# Patient Record
Sex: Female | Born: 1990
Health system: Southern US, Community
[De-identification: ages and names within clinical notes are randomized; demographics above are authoritative.]

---

## 2010-12-08 NOTE — L&D Delivery Note (Signed)
Delivery Note At 5:17 AM a viable female was delivered via Vaginal, Spontaneous Delivery (Presentation: ;  ).  APGAR: , ; weight .   Placenta status: Intact, Pathology.  Cord: 3 vessels with the following complications: None.  Cord pH: not done  Anesthesia: Epidural  Episiotomy: None Lacerations: 1st degree;Perineal Suture Repair: 2.0 vicryl Est. Blood Loss (mL):   Mom to postpartum.  Baby to nursery-stable.  Jamie Sosa A 08/13/2011, 5:31 AM

## 2011-01-13 ENCOUNTER — Inpatient Hospital Stay (INDEPENDENT_AMBULATORY_CARE_PROVIDER_SITE_OTHER)
Admission: RE | Admit: 2011-01-13 | Discharge: 2011-01-13 | Disposition: A | Discharge status: Home / Self Care | Payer: Self-pay | Source: Ambulatory Visit | Attending: Family Medicine | Admitting: Family Medicine

## 2011-01-13 DIAGNOSIS — O2390 Unspecified genitourinary tract infection in pregnancy, unspecified trimester: Secondary | ICD-10-CM

## 2011-01-13 LAB — POCT URINALYSIS DIPSTICK
Ketones, ur: NEGATIVE mg/dL
Protein, ur: 30 mg/dL — AB
pH: 6 (ref 5.0–8.0)

## 2011-01-13 LAB — WET PREP, GENITAL

## 2011-01-15 LAB — GC/CHLAMYDIA PROBE AMP, GENITAL: GC Probe Amp, Genital: NEGATIVE

## 2011-05-20 ENCOUNTER — Other Ambulatory Visit (HOSPITAL_COMMUNITY): Payer: Self-pay | Admitting: Obstetrics

## 2011-05-20 DIAGNOSIS — Z3689 Encounter for other specified antenatal screening: Secondary | ICD-10-CM

## 2011-05-23 ENCOUNTER — Encounter (HOSPITAL_COMMUNITY): Payer: Self-pay

## 2011-05-23 ENCOUNTER — Ambulatory Visit (HOSPITAL_COMMUNITY)
Admission: RE | Admit: 2011-05-23 | Discharge: 2011-05-23 | Disposition: A | Discharge status: Home / Self Care | Payer: Medicaid Other | Source: Ambulatory Visit | Attending: Obstetrics | Admitting: Obstetrics

## 2011-05-23 DIAGNOSIS — Z3689 Encounter for other specified antenatal screening: Secondary | ICD-10-CM

## 2011-05-23 DIAGNOSIS — Z363 Encounter for antenatal screening for malformations: Secondary | ICD-10-CM | POA: Insufficient documentation

## 2011-05-23 DIAGNOSIS — Z1389 Encounter for screening for other disorder: Secondary | ICD-10-CM | POA: Insufficient documentation

## 2011-05-23 DIAGNOSIS — O358XX0 Maternal care for other (suspected) fetal abnormality and damage, not applicable or unspecified: Secondary | ICD-10-CM | POA: Insufficient documentation

## 2011-05-23 DIAGNOSIS — O0930 Supervision of pregnancy with insufficient antenatal care, unspecified trimester: Secondary | ICD-10-CM | POA: Insufficient documentation

## 2011-05-23 LAB — ANTIBODY SCREEN: Antibody Screen: NEGATIVE

## 2011-05-23 LAB — RUBELLA ANTIBODY, IGM: Rubella: IMMUNE

## 2011-05-23 LAB — HIV ANTIBODY (ROUTINE TESTING W REFLEX): HIV: NONREACTIVE

## 2011-05-29 ENCOUNTER — Other Ambulatory Visit (HOSPITAL_COMMUNITY): Payer: Self-pay | Admitting: Obstetrics

## 2011-05-29 DIAGNOSIS — O359XX0 Maternal care for (suspected) fetal abnormality and damage, unspecified, not applicable or unspecified: Secondary | ICD-10-CM

## 2011-06-13 ENCOUNTER — Ambulatory Visit (HOSPITAL_COMMUNITY)
Admission: RE | Admit: 2011-06-13 | Discharge: 2011-06-13 | Disposition: A | Discharge status: Home / Self Care | Payer: BC Managed Care – PPO | Source: Ambulatory Visit | Attending: Obstetrics | Admitting: Obstetrics

## 2011-06-13 DIAGNOSIS — O0930 Supervision of pregnancy with insufficient antenatal care, unspecified trimester: Secondary | ICD-10-CM | POA: Insufficient documentation

## 2011-06-13 DIAGNOSIS — Z3689 Encounter for other specified antenatal screening: Secondary | ICD-10-CM | POA: Insufficient documentation

## 2011-06-13 DIAGNOSIS — O359XX0 Maternal care for (suspected) fetal abnormality and damage, unspecified, not applicable or unspecified: Secondary | ICD-10-CM

## 2011-07-14 LAB — STREP B DNA PROBE: GBS: NEGATIVE

## 2011-08-07 ENCOUNTER — Telehealth (HOSPITAL_COMMUNITY): Payer: Self-pay | Admitting: *Deleted

## 2011-08-08 ENCOUNTER — Encounter (HOSPITAL_COMMUNITY): Payer: Self-pay | Admitting: *Deleted

## 2011-08-08 ENCOUNTER — Telehealth (HOSPITAL_COMMUNITY): Payer: Self-pay | Admitting: *Deleted

## 2011-08-08 NOTE — Telephone Encounter (Signed)
Preadmission screen  

## 2011-08-11 ENCOUNTER — Inpatient Hospital Stay (HOSPITAL_COMMUNITY)
Admission: RE | Admit: 2011-08-11 | Discharge: 2011-08-15 | DRG: 373 | Disposition: A | Discharge status: Home / Self Care | Payer: BC Managed Care – PPO | Source: Ambulatory Visit | Attending: Obstetrics | Admitting: Obstetrics

## 2011-08-11 ENCOUNTER — Encounter (HOSPITAL_COMMUNITY): Payer: Self-pay

## 2011-08-11 LAB — CBC
Platelets: 257 10*3/uL (ref 150–400)
RDW: 14 % (ref 11.5–15.5)
WBC: 10.1 10*3/uL (ref 4.0–10.5)

## 2011-08-11 MED ORDER — LACTATED RINGERS IV SOLN
INTRAVENOUS | Status: DC
  Administered 2011-08-11 – 2011-08-13 (×7): via INTRAVENOUS

## 2011-08-11 MED ORDER — IBUPROFEN 600 MG PO TABS
600.0000 mg | ORAL_TABLET | Freq: Four times a day (QID) | ORAL | Status: DC | PRN
  Administered 2011-08-13: 600 mg via ORAL
  Filled 2011-08-11: qty 1

## 2011-08-11 MED ORDER — LACTATED RINGERS IV SOLN
500.0000 mL | INTRAVENOUS | Status: DC | PRN
  Administered 2011-08-11 – 2011-08-12 (×3): 500 mL via INTRAVENOUS

## 2011-08-11 MED ORDER — OXYCODONE-ACETAMINOPHEN 5-325 MG PO TABS: 2.0000 | ORAL_TABLET | ORAL | Status: DC | PRN

## 2011-08-11 MED ORDER — MISOPROSTOL 25 MCG QUARTER TABLET
25.0000 ug | ORAL_TABLET | ORAL | Status: DC | PRN
  Administered 2011-08-11 – 2011-08-12 (×2): 25 ug via VAGINAL
  Filled 2011-08-11 (×2): qty 0.25
  Filled 2011-08-11: qty 1

## 2011-08-11 MED ORDER — LIDOCAINE HCL (PF) 1 % IJ SOLN
30.0000 mL | INTRAMUSCULAR | Status: AC | PRN
  Administered 2011-08-13: 30 mL via SUBCUTANEOUS
  Filled 2011-08-11: qty 30

## 2011-08-11 MED ORDER — TERBUTALINE SULFATE 1 MG/ML IJ SOLN: 0.2500 mg | Freq: Once | INTRAMUSCULAR | Status: AC | PRN

## 2011-08-11 MED ORDER — BUTORPHANOL TARTRATE 2 MG/ML IJ SOLN: 1.0000 mg | INTRAMUSCULAR | Status: DC | PRN

## 2011-08-11 MED ORDER — ACETAMINOPHEN 325 MG PO TABS
650.0000 mg | ORAL_TABLET | ORAL | Status: DC | PRN
  Administered 2011-08-13: 650 mg via ORAL
  Filled 2011-08-11: qty 2

## 2011-08-11 MED ORDER — CITRIC ACID-SODIUM CITRATE 334-500 MG/5ML PO SOLN: 30.0000 mL | ORAL | Status: DC | PRN

## 2011-08-11 MED ORDER — OXYTOCIN BOLUS FROM INFUSION
500.0000 mL | Freq: Once | INTRAVENOUS | Status: AC
  Administered 2011-08-13: 500 mL via INTRAVENOUS
  Filled 2011-08-11: qty 500
  Filled 2011-08-11: qty 1000

## 2011-08-11 MED ORDER — OXYTOCIN 20 UNITS IN LACTATED RINGERS INFUSION - SIMPLE
125.0000 mL/h | Freq: Once | INTRAVENOUS | Status: AC
  Administered 2011-08-13: 500 mL/h via INTRAVENOUS

## 2011-08-11 MED ORDER — ONDANSETRON HCL 4 MG/2ML IJ SOLN
4.0000 mg | Freq: Four times a day (QID) | INTRAMUSCULAR | Status: DC | PRN
  Administered 2011-08-12 – 2011-08-13 (×2): 4 mg via INTRAVENOUS
  Filled 2011-08-11 (×2): qty 2

## 2011-08-11 NOTE — Plan of Care (Signed)
Problem: Consults Goal: Birthing Suites Patient Information Press F2 to bring up selections list  Outcome: Completed/Met Date Met:  08/11/11  Pt > [redacted] weeks EGA and Inpatient induction

## 2011-08-12 ENCOUNTER — Encounter (HOSPITAL_COMMUNITY): Payer: Self-pay | Admitting: Anesthesiology

## 2011-08-12 ENCOUNTER — Inpatient Hospital Stay (HOSPITAL_COMMUNITY): Payer: BC Managed Care – PPO | Admitting: Anesthesiology

## 2011-08-12 LAB — RPR: RPR Ser Ql: NONREACTIVE

## 2011-08-12 MED ORDER — EPHEDRINE 5 MG/ML INJ
10.0000 mg | INTRAVENOUS | Status: DC | PRN
  Filled 2011-08-12: qty 4

## 2011-08-12 MED ORDER — LIDOCAINE-EPINEPHRINE (PF) 2 %-1:200000 IJ SOLN
INTRAMUSCULAR | Status: AC
  Filled 2011-08-12: qty 20

## 2011-08-12 MED ORDER — TERBUTALINE SULFATE 1 MG/ML IJ SOLN: 0.2500 mg | Freq: Once | INTRAMUSCULAR | Status: AC | PRN

## 2011-08-12 MED ORDER — PHENYLEPHRINE 40 MCG/ML (10ML) SYRINGE FOR IV PUSH (FOR BLOOD PRESSURE SUPPORT)
80.0000 ug | PREFILLED_SYRINGE | INTRAVENOUS | Status: DC | PRN
  Filled 2011-08-12: qty 5

## 2011-08-12 MED ORDER — PHENYLEPHRINE 40 MCG/ML (10ML) SYRINGE FOR IV PUSH (FOR BLOOD PRESSURE SUPPORT)
80.0000 ug | PREFILLED_SYRINGE | INTRAVENOUS | Status: DC | PRN
  Filled 2011-08-12 (×2): qty 5

## 2011-08-12 MED ORDER — LACTATED RINGERS IV SOLN
500.0000 mL | Freq: Once | INTRAVENOUS | Status: AC
  Administered 2011-08-12: 1000 mL via INTRAVENOUS

## 2011-08-12 MED ORDER — SODIUM BICARBONATE 8.4 % IV SOLN
INTRAVENOUS | Status: DC | PRN
  Administered 2011-08-12 (×2): 5 mL via EPIDURAL

## 2011-08-12 MED ORDER — DIPHENHYDRAMINE HCL 50 MG/ML IJ SOLN: 12.5000 mg | INTRAMUSCULAR | Status: DC | PRN

## 2011-08-12 MED ORDER — FENTANYL 2.5 MCG/ML BUPIVACAINE 1/10 % EPIDURAL INFUSION (WH - ANES)
14.0000 mL/h | INTRAMUSCULAR | Status: DC
  Administered 2011-08-12 (×2): 14 mL/h via EPIDURAL
  Administered 2011-08-12: 13 mL/h via EPIDURAL
  Administered 2011-08-12 – 2011-08-13 (×4): 14 mL/h via EPIDURAL
  Filled 2011-08-12 (×7): qty 60

## 2011-08-12 MED ORDER — EPHEDRINE 5 MG/ML INJ
10.0000 mg | INTRAVENOUS | Status: DC | PRN
  Filled 2011-08-12 (×2): qty 4

## 2011-08-12 MED ORDER — OXYTOCIN 20 UNITS IN LACTATED RINGERS INFUSION - SIMPLE
1.0000 m[IU]/min | INTRAVENOUS | Status: DC
  Administered 2011-08-12: 1 m[IU]/min via INTRAVENOUS
  Filled 2011-08-12: qty 1000

## 2011-08-12 MED ORDER — SODIUM BICARBONATE 8.4 % IV SOLN
INTRAVENOUS | Status: AC
  Filled 2011-08-12: qty 50

## 2011-08-12 MED ORDER — OXYTOCIN 20 UNITS IN LACTATED RINGERS INFUSION - SIMPLE: 1.0000 m[IU]/min | INTRAVENOUS | Status: DC

## 2011-08-12 NOTE — Anesthesia Procedure Notes (Addendum)
Epidural  Start time: 08/12/2011 9:59 AM  Staffing Anesthesiologist: Cristela Blue EDWARD  Needle:  Needle insertion depth: 6 cm Catheter at skin depth: 12 cm  Additional Notes Dosing of Epidural: 1st dose, Through needle...... 4mg  Marcaine 2nd dose, through catheter.... epi 1:200K + Xylocaine 40 mg 3rd dose, through catheter...Marland KitchenMarland Kitchenepi 1:200K + Xylocaine 40 mg Each dose occurred after waiting 3 min,patient was free of IV sx; and patient exhibits no evidence of SA injection  Patient is more comfortable after epidural dosed. Please see RN's note for documentation of vital signs,and FHR which are stable.

## 2011-08-12 NOTE — Progress Notes (Signed)
Ok to not give another dose of cytotec; start pitocin.

## 2011-08-12 NOTE — Anesthesia Preprocedure Evaluation (Signed)

## 2011-08-12 NOTE — Progress Notes (Signed)
  Cervix 2 cm 80% vertex -3 station Membranes ruptured fluid clear Continue low-dose Pitocin Reactive tracing

## 2011-08-12 NOTE — Progress Notes (Signed)
  5 PM today cervix was 7 cm and the vertex -1 station She's no 7-8 cm 100% with the vertex at a 0 to plus one station And IUPC was inserted she's now on 50 milliunits of Pitocin slow progress Tracing reactive

## 2011-08-13 ENCOUNTER — Other Ambulatory Visit: Payer: Self-pay | Admitting: Obstetrics

## 2011-08-13 ENCOUNTER — Encounter (HOSPITAL_COMMUNITY): Payer: Self-pay

## 2011-08-13 MED ORDER — DIBUCAINE 1 % RE OINT: 1.0000 "application " | TOPICAL_OINTMENT | RECTAL | Status: DC | PRN

## 2011-08-13 MED ORDER — BENZOCAINE-MENTHOL 20-0.5 % EX AERO
INHALATION_SPRAY | CUTANEOUS | Status: AC
  Filled 2011-08-13: qty 56

## 2011-08-13 MED ORDER — PRENATAL PLUS 27-1 MG PO TABS
1.0000 | ORAL_TABLET | Freq: Every day | ORAL | Status: DC
  Administered 2011-08-13 – 2011-08-15 (×3): 1 via ORAL
  Filled 2011-08-13 (×3): qty 1

## 2011-08-13 MED ORDER — DIPHENHYDRAMINE HCL 25 MG PO CAPS: 25.0000 mg | ORAL_CAPSULE | Freq: Four times a day (QID) | ORAL | Status: DC | PRN

## 2011-08-13 MED ORDER — ONDANSETRON HCL 4 MG/2ML IJ SOLN: 4.0000 mg | INTRAMUSCULAR | Status: DC | PRN

## 2011-08-13 MED ORDER — WITCH HAZEL-GLYCERIN EX PADS: 1.0000 "application " | MEDICATED_PAD | CUTANEOUS | Status: DC | PRN

## 2011-08-13 MED ORDER — LANOLIN HYDROUS EX OINT: TOPICAL_OINTMENT | CUTANEOUS | Status: DC | PRN

## 2011-08-13 MED ORDER — ZOLPIDEM TARTRATE 5 MG PO TABS: 5.0000 mg | ORAL_TABLET | Freq: Every evening | ORAL | Status: DC | PRN

## 2011-08-13 MED ORDER — SODIUM CHLORIDE 0.9 % IV SOLN
2.0000 mg | Freq: Once | INTRAVENOUS | Status: AC
  Administered 2011-08-13: 2 mg via INTRAVENOUS
  Filled 2011-08-13: qty 2

## 2011-08-13 MED ORDER — SIMETHICONE 80 MG PO CHEW: 80.0000 mg | CHEWABLE_TABLET | ORAL | Status: DC | PRN

## 2011-08-13 MED ORDER — FERROUS SULFATE 325 (65 FE) MG PO TABS
325.0000 mg | ORAL_TABLET | Freq: Two times a day (BID) | ORAL | Status: DC
  Administered 2011-08-13 – 2011-08-15 (×4): 325 mg via ORAL
  Filled 2011-08-13 (×4): qty 1

## 2011-08-13 MED ORDER — TETANUS-DIPHTH-ACELL PERTUSSIS 5-2.5-18.5 LF-MCG/0.5 IM SUSP
0.5000 mL | Freq: Once | INTRAMUSCULAR | Status: AC
  Administered 2011-08-14: 0.5 mL via INTRAMUSCULAR
  Filled 2011-08-13: qty 0.5

## 2011-08-13 MED ORDER — BENZOCAINE-MENTHOL 20-0.5 % EX AERO
1.0000 "application " | INHALATION_SPRAY | CUTANEOUS | Status: DC | PRN
  Administered 2011-08-13: 1 via TOPICAL

## 2011-08-13 MED ORDER — SENNOSIDES-DOCUSATE SODIUM 8.6-50 MG PO TABS
2.0000 | ORAL_TABLET | Freq: Every day | ORAL | Status: DC
  Administered 2011-08-13 – 2011-08-14 (×2): 2 via ORAL

## 2011-08-13 MED ORDER — LIDOCAINE HCL (PF) 2 % IJ SOLN
INTRAMUSCULAR | Status: DC | PRN
  Administered 2011-08-13 (×3): 5 mL

## 2011-08-13 MED ORDER — OXYTOCIN 10 UNIT/ML IJ SOLN
INTRAMUSCULAR | Status: AC
  Administered 2011-08-13: 20 [IU]
  Filled 2011-08-13: qty 2

## 2011-08-13 MED ORDER — IBUPROFEN 600 MG PO TABS
600.0000 mg | ORAL_TABLET | Freq: Four times a day (QID) | ORAL | Status: DC
  Administered 2011-08-13 – 2011-08-15 (×9): 600 mg via ORAL
  Filled 2011-08-13 (×9): qty 1

## 2011-08-13 MED ORDER — OXYCODONE-ACETAMINOPHEN 5-325 MG PO TABS: 1.0000 | ORAL_TABLET | ORAL | Status: DC | PRN

## 2011-08-13 MED ORDER — METHYLERGONOVINE MALEATE 0.2 MG/ML IJ SOLN
INTRAMUSCULAR | Status: AC
  Administered 2011-08-13: 05:00:00 via INTRAMUSCULAR
  Filled 2011-08-13: qty 1

## 2011-08-13 MED ORDER — ONDANSETRON HCL 4 MG PO TABS: 4.0000 mg | ORAL_TABLET | ORAL | Status: DC | PRN

## 2011-08-13 NOTE — Progress Notes (Signed)
Encounter addended by: Edison Pace, CRNA on: 08/13/2011  1:26 PM<BR>     Documentation filed: Notes Section, Charges VN

## 2011-08-13 NOTE — Anesthesia Postprocedure Evaluation (Signed)
Anesthesia Post Note  Patient: Jamie Sosa  Procedure(s) Performed: * No procedures listed *  Anesthesia type: Epidural  Patient location: Mother/Baby  Post pain: Pain level controlled  Post assessment: Post-op Vital signs reviewed  Last Vitals:  Filed Vitals:   08/13/11 0631  BP: 120/83  Pulse: 94  Temp: 99.2 F (37.3 C)  Resp: 18    Post vital signs: Reviewed  Level of consciousness: awake  Complications: No apparent anesthesia complications

## 2011-08-13 NOTE — Progress Notes (Signed)
MD notified of FHR tracing & maternal temp. Order received to continue monitoring FHR and notify if reaches 200 bpm. Order received for ampicillin 2gm IV.

## 2011-08-13 NOTE — Anesthesia Postprocedure Evaluation (Signed)
  Anesthesia Post-op Note  Patient: Jamie Sosa  Procedure(s) Performed: * No procedures listed *  Patient Location: PACU and Mother/Baby  Anesthesia Type: Epidural  Level of Consciousness: awake, alert  and oriented  Airway and Oxygen Therapy: Patient Spontanous Breathing  Post-op Assessment: Post-op Vital signs reviewed  Post-op Vital Signs: Reviewed and stable  Complications: No apparent anesthesia complications

## 2011-08-13 NOTE — Progress Notes (Signed)
UR chart review completed.  

## 2011-08-14 LAB — CBC
HCT: 26.6 % — ABNORMAL LOW (ref 36.0–46.0)
Hemoglobin: 8.9 g/dL — ABNORMAL LOW (ref 12.0–15.0)
MCV: 94.3 fL (ref 78.0–100.0)
RDW: 14.3 % (ref 11.5–15.5)
WBC: 14.8 10*3/uL — ABNORMAL HIGH (ref 4.0–10.5)

## 2011-08-14 NOTE — Progress Notes (Signed)
Postpartum day one Vital signs normal Fundus firm Lochia moderate Legs negative No complaints    Postpartum day one Vital signs normal Fundus firm Lochia moderate Legs negative No complaints

## 2011-08-15 MED ORDER — BENZOCAINE-MENTHOL 20-0.5 % EX AERO: 1.0000 "application " | INHALATION_SPRAY | CUTANEOUS | Status: AC | PRN

## 2011-08-15 MED ORDER — FERROUS SULFATE 325 (65 FE) MG PO TABS: 325.0000 mg | ORAL_TABLET | Freq: Two times a day (BID) | ORAL | Status: AC

## 2011-08-15 NOTE — Progress Notes (Signed)
PSYCHOSOCIAL ASSESSMENT ~ MATERNAL/CHILD Name:  Jamie Sosa                                                                                               Age: 20   Referral Date:        09/ 07  / 12  Reason/Source: LPNC / CN  I. FAMILY/HOME ENVIRONMENT A. Child's Legal Guardian _X__Parent(s) ___Grandparent ___Foster parent ___DSS_________________ Name:  Jamie Sosa                                                              DOB: //                     Age: 4  Address: [address] [place], [place] [address]  Name:  Jamie Sosa                                                              DOB: //             Age: 10  Address: (same as above)  B. Other Household Members/Support Persons Name:                                         Relationship:                        DOB ___/___/___                   Name:                                         Relationship:                        DOB ___/___/___                   Name:                                         Relationship:                        DOB ___/___/___                   Name:  Relationship:                        DOB ___/___/___  C. Other Support:   II. PSYCHOSOCIAL DATA A. Information Source                                                                                             _X_Patient Interview  __Family Interview           __Other___________  B. Surveyor, quantity and [employer] __Employment: _X_Medicaid    County:  [place]               _X_Private Insurance: BCBS                  __Self Pay  __Food Stamps   _X_WIC __[employer]     __Public Housing     __Section 8    __Maternity Care Coordination/Child Service Coordination/Early Intervention   ___School:                                                                         Grade:  __Other:   Salena Saner Cultural and Environment Information Cultural Issues Impacting Care:  III. STRENGTHS __X_Supportive  family/friends __X_Adequate Resources ___Compliance with medical plan __X_Home prepared for Child (including basic supplies) ___Understanding of illness      ___Other: RISK FACTORS AND CURRENT PROBLEMS         ____No Problems Noted          LPNC  IV. SOCIAL WORK ASSESSMENT  Pt told Sw that she confirmed pregnancy at [facility] in 3rd month of pregnancy.  Pt was unsure if she wanted to go through with the pregnancy or terminate.  Additionally, pt was "scared" to tell her family, as she didn't not know how they would react.     Pt is happy about being a parent and appears to be bonding well with the infant.  Once pt did tell her family, they were supportive.  FOB is at bedside and supportive as well.  She denies any illegal substance use.  UDS is negative and Meconium is pending.  Pt verbalized understanding of hospital drug testing policy.  She has all the supplies for the infant.  Sw will follow up with drug screen results and make referral if needed.     V. SOCIAL WORK PLAN  _X__No Further Intervention Required/No Barriers to Discharge   ___Psychosocial Support and Ongoing Assessment of Needs   ___Patient/Family Education:   ___Child Protective Services Report   County___________ Date___/____/____   ___Information/Referral to [employer]_________________________   ___Other:

## 2011-08-15 NOTE — Discharge Summary (Signed)
Obstetric Discharge Summary Reason for Admission: onset of labor Prenatal Procedures: none Intrapartum Procedures: spontaneous vaginal delivery Postpartum Procedures: none Complications-Operative and Postpartum: none Hemoglobin  Date Value Range Status  08/14/2011 8.9* 12.0-15.0 (g/dL) Final     HCT  Date Value Range Status  08/14/2011 26.6* 36.0-46.0 (%) Final    Discharge Diagnoses: Term Pregnancy-delivered  Discharge Information: Date: 08/15/2011 Activity: pelvic rest Diet: routine Medications: Tylenol #3 Condition: stable Instructions: refer to practice specific booklet Discharge to: home Follow-up Information    Follow up with Lamont Glasscock A, MD. Call in 6 weeks.   Contact information:   [address] [place] [address] [phone]          Newborn Data: Live born female  Birth Weight: 8 lb 7.3 oz (3835 g) APGAR: 2, 8  Home with mother.  Jamie Sosa A 08/15/2011, 6:39 AM

## 2011-11-17 NOTE — Telephone Encounter (Signed)
Preadmission screen  

## 2014-10-09 ENCOUNTER — Encounter (HOSPITAL_COMMUNITY): Payer: Self-pay

## 2018-07-09 ENCOUNTER — Other Ambulatory Visit: Payer: Self-pay | Admitting: Obstetrics and Gynecology

## 2018-07-09 ENCOUNTER — Other Ambulatory Visit (HOSPITAL_COMMUNITY)
Admission: RE | Admit: 2018-07-09 | Discharge: 2018-07-09 | Disposition: A | Discharge status: Home / Self Care | Payer: BLUE CROSS/BLUE SHIELD | Source: Ambulatory Visit | Attending: Obstetrics and Gynecology | Admitting: Obstetrics and Gynecology

## 2018-07-09 DIAGNOSIS — Z01419 Encounter for gynecological examination (general) (routine) without abnormal findings: Secondary | ICD-10-CM | POA: Insufficient documentation

## 2018-07-09 DIAGNOSIS — Z01411 Encounter for gynecological examination (general) (routine) with abnormal findings: Secondary | ICD-10-CM | POA: Diagnosis not present

## 2018-07-09 DIAGNOSIS — Z30432 Encounter for removal of intrauterine contraceptive device: Secondary | ICD-10-CM | POA: Diagnosis not present

## 2018-07-09 DIAGNOSIS — N898 Other specified noninflammatory disorders of vagina: Secondary | ICD-10-CM | POA: Diagnosis not present

## 2018-07-13 LAB — CYTOLOGY - PAP: Diagnosis: NEGATIVE

## 2019-07-25 DIAGNOSIS — Z20828 Contact with and (suspected) exposure to other viral communicable diseases: Secondary | ICD-10-CM | POA: Diagnosis not present

## 2019-11-24 DIAGNOSIS — Z20828 Contact with and (suspected) exposure to other viral communicable diseases: Secondary | ICD-10-CM | POA: Diagnosis not present

## 2020-01-14 DIAGNOSIS — Z20828 Contact with and (suspected) exposure to other viral communicable diseases: Secondary | ICD-10-CM | POA: Diagnosis not present

## 2020-04-11 ENCOUNTER — Other Ambulatory Visit (HOSPITAL_BASED_OUTPATIENT_CLINIC_OR_DEPARTMENT_OTHER): Payer: Self-pay | Admitting: Family Medicine

## 2020-04-11 ENCOUNTER — Encounter (HOSPITAL_COMMUNITY): Payer: Self-pay

## 2020-04-11 ENCOUNTER — Other Ambulatory Visit (HOSPITAL_COMMUNITY): Payer: Self-pay | Admitting: Family Medicine

## 2020-04-11 ENCOUNTER — Other Ambulatory Visit: Payer: Self-pay

## 2020-04-11 ENCOUNTER — Ambulatory Visit (HOSPITAL_COMMUNITY): Admission: RE | Admit: 2020-04-11 | Payer: Self-pay | Source: Ambulatory Visit

## 2020-04-11 ENCOUNTER — Ambulatory Visit (HOSPITAL_BASED_OUTPATIENT_CLINIC_OR_DEPARTMENT_OTHER)
Admission: RE | Admit: 2020-04-11 | Discharge: 2020-04-11 | Disposition: A | Discharge status: Home / Self Care | Payer: BC Managed Care – PPO | Source: Ambulatory Visit | Attending: Family Medicine | Admitting: Family Medicine

## 2020-04-11 DIAGNOSIS — M79605 Pain in left leg: Secondary | ICD-10-CM | POA: Diagnosis not present

## 2020-04-11 DIAGNOSIS — M79662 Pain in left lower leg: Secondary | ICD-10-CM | POA: Diagnosis not present

## 2020-04-11 DIAGNOSIS — M7989 Other specified soft tissue disorders: Secondary | ICD-10-CM

## 2020-04-11 IMAGING — US US EXTREM LOW VENOUS*L*
1 series · 14 of 24 positions shown · non-contrast
Comparison: None.

CLINICAL DATA: Left lower extremity pain

EXAM:
LEFT LOWER EXTREMITY VENOUS DOPPLER ULTRASOUND
TECHNIQUE: Gray-scale sonography with compression, as well as color and duplex
ultrasound, were performed to evaluate the deep venous system(s)
from the level of the common femoral vein through the popliteal and
proximal calf veins.

[Series 1: us extrem low venous*left* · 14 of 28 slices shown]
[im 1/28]
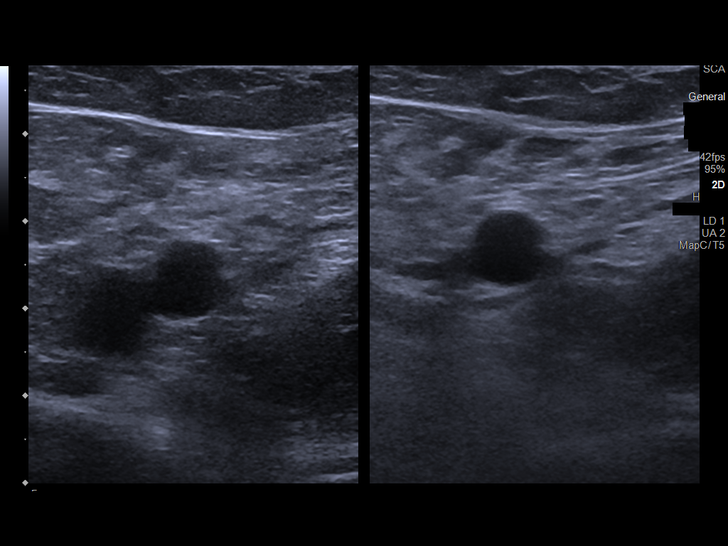
[im 3/28]
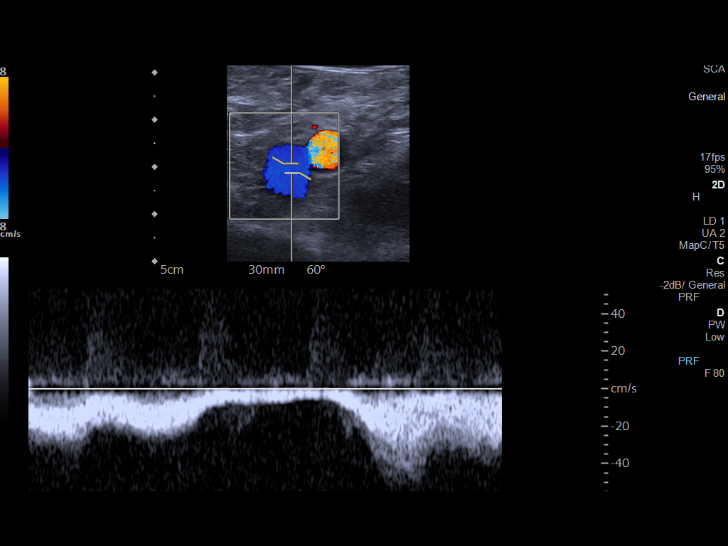
[im 5/28]
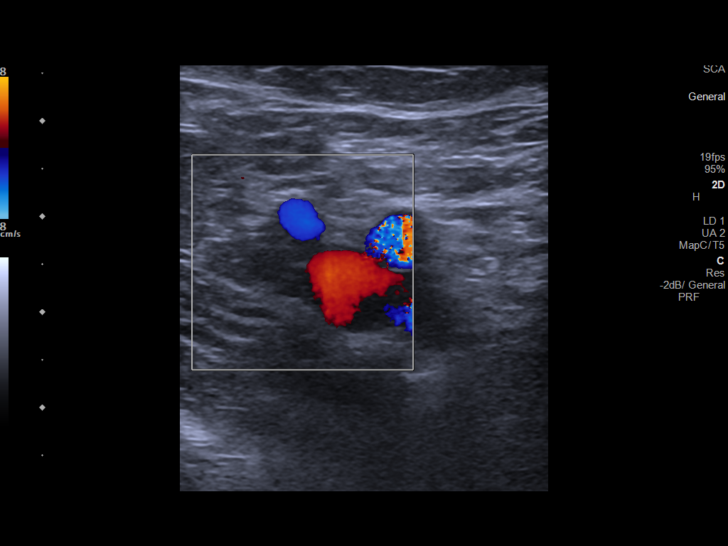
[im 8/28]
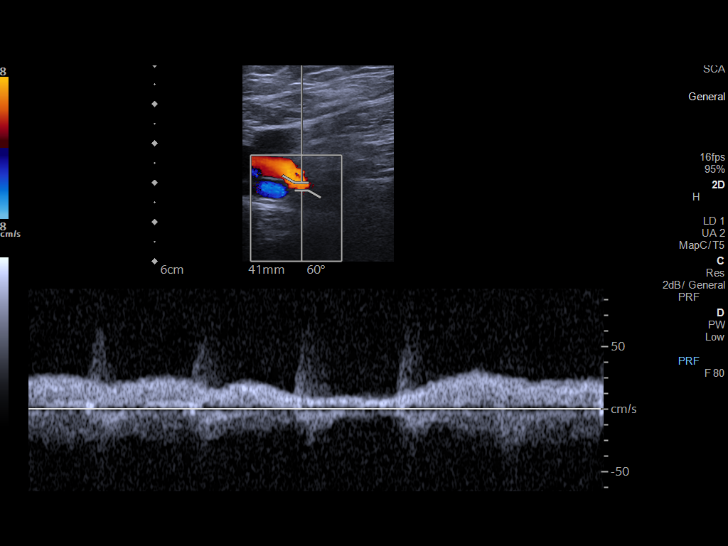
[im 9/28]
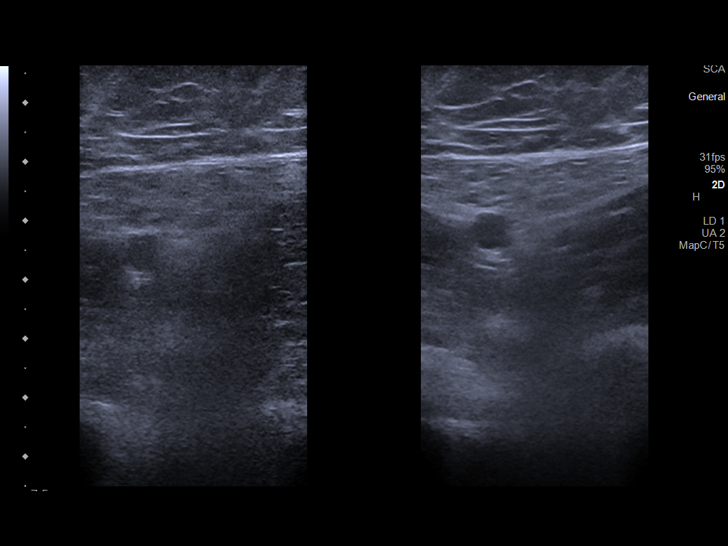
[im 11/28]
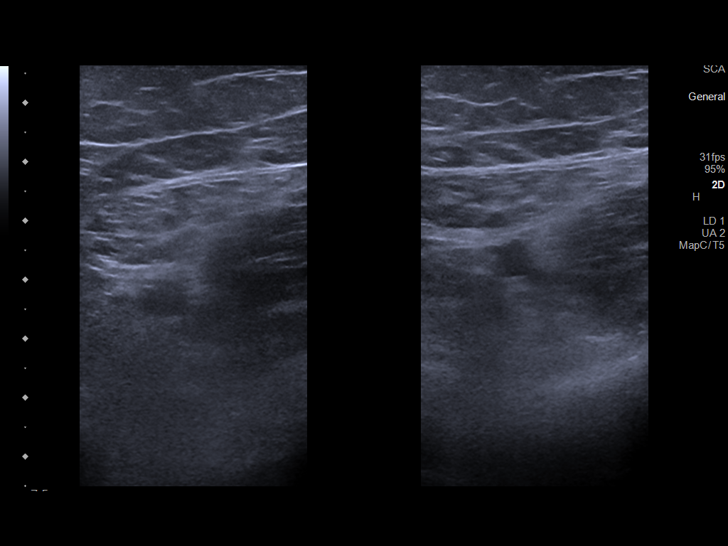
[im 13/28]
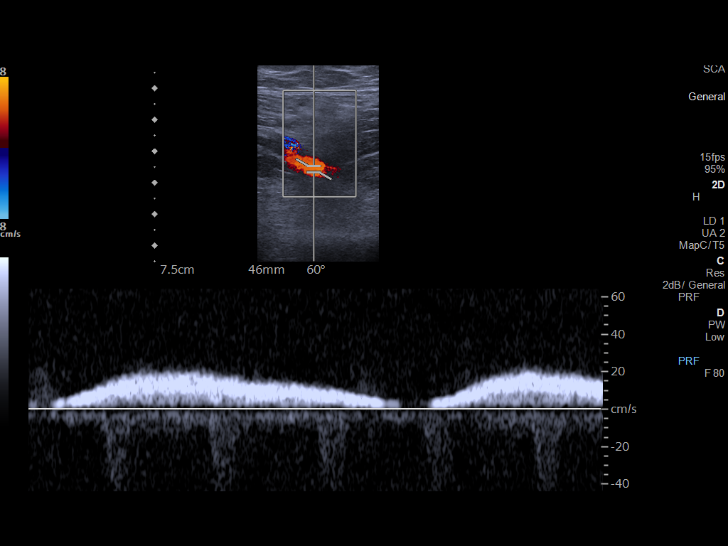
[im 15/28]
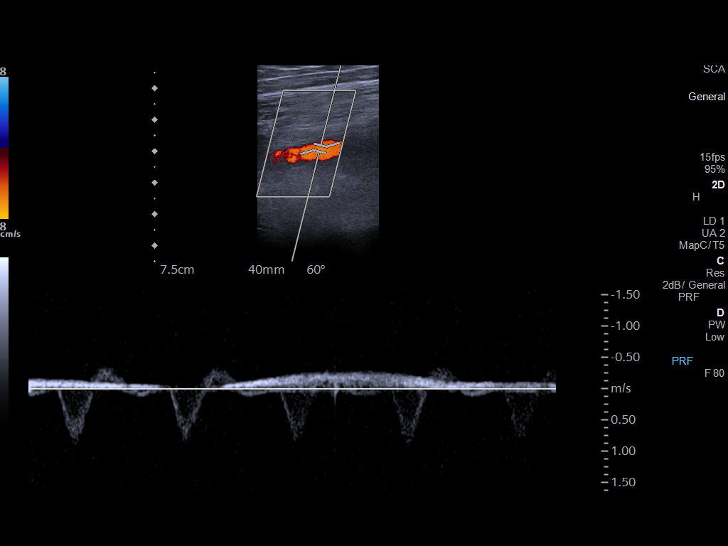
[im 17/28]
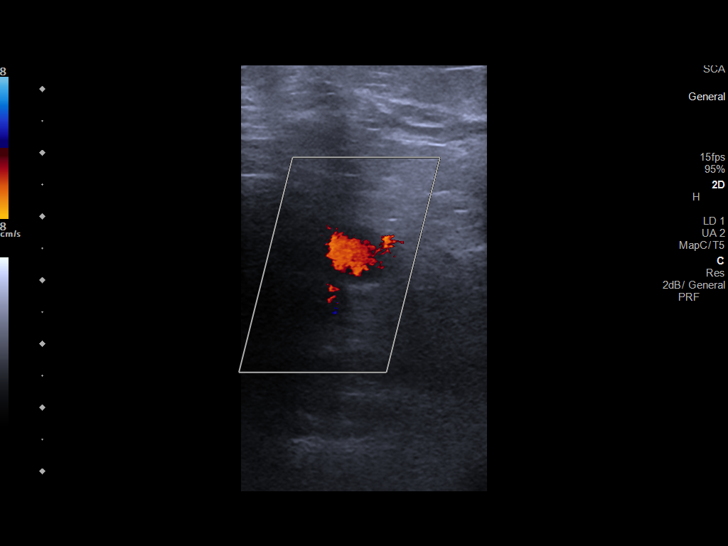
[im 19/28]
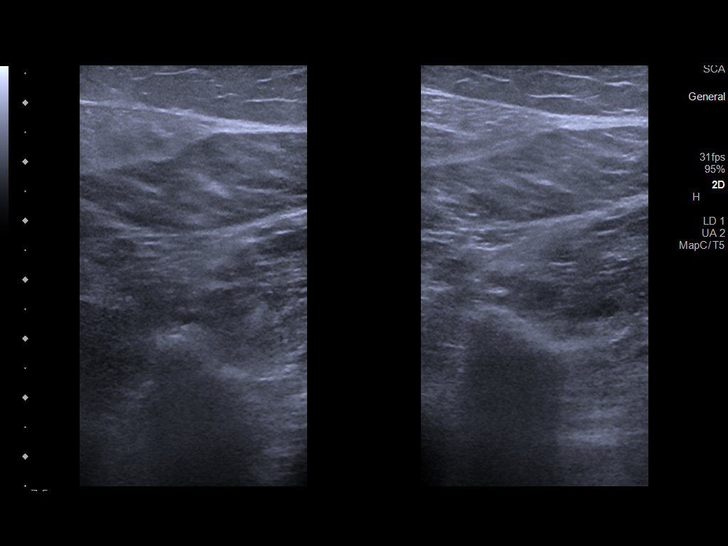
[im 22/28]
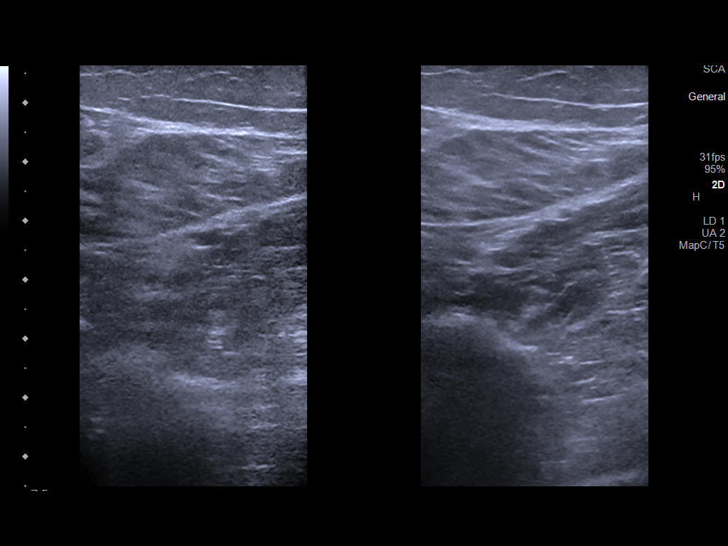
[im 23/28]
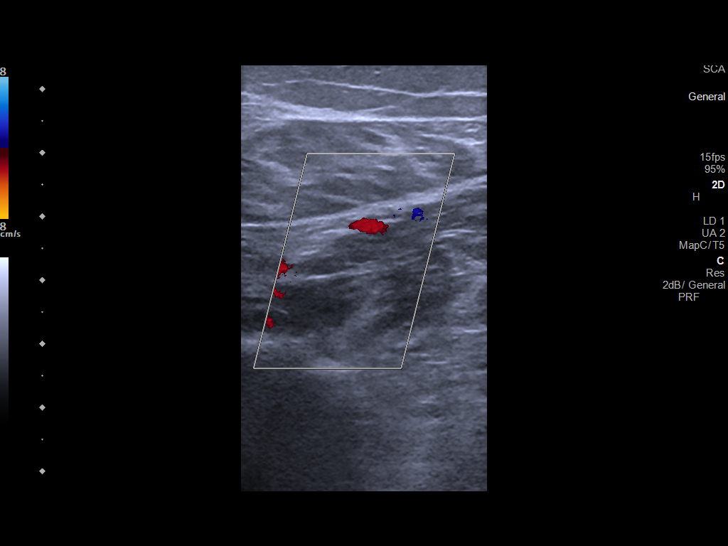
[im 25/28]
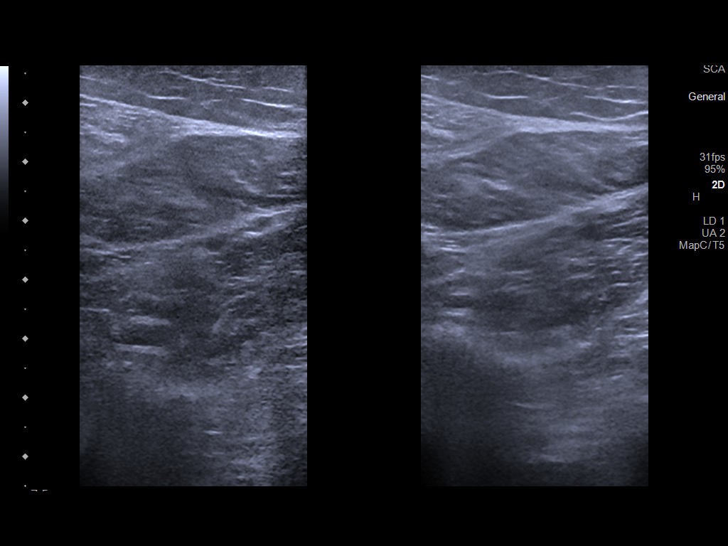
[im 28/28]
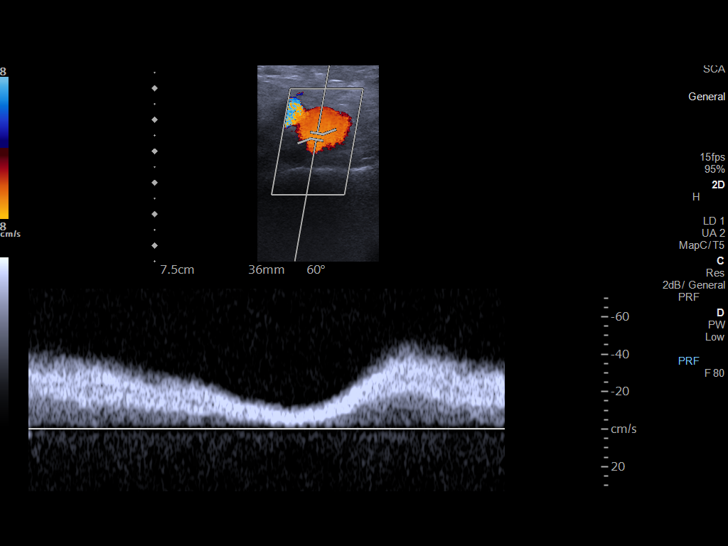

[14 of 24 positions shown; findings below may reference images not displayed]

FINDINGS: VENOUS

Normal compressibility of the common femoral, superficial femoral,
and popliteal veins, as well as the visualized calf veins.
Visualized portions of profunda femoral vein and great saphenous
vein unremarkable. No filling defects to suggest DVT on grayscale or
color Doppler imaging. Doppler waveforms show normal direction of
venous flow, normal respiratory plasticity and response to
augmentation.

Limited views of the contralateral common femoral vein are
unremarkable.

OTHER

None.

Limitations: none
IMPRESSION: Negative.

## 2020-09-20 DIAGNOSIS — M79621 Pain in right upper arm: Secondary | ICD-10-CM | POA: Diagnosis not present

## 2020-09-24 DIAGNOSIS — S5400XA Injury of ulnar nerve at forearm level, unspecified arm, initial encounter: Secondary | ICD-10-CM | POA: Diagnosis not present

## 2020-12-03 DIAGNOSIS — H93293 Other abnormal auditory perceptions, bilateral: Secondary | ICD-10-CM | POA: Diagnosis not present

## 2020-12-11 DIAGNOSIS — R202 Paresthesia of skin: Secondary | ICD-10-CM | POA: Diagnosis not present

## 2020-12-11 DIAGNOSIS — Z23 Encounter for immunization: Secondary | ICD-10-CM | POA: Diagnosis not present

## 2020-12-11 DIAGNOSIS — Z0000 Encounter for general adult medical examination without abnormal findings: Secondary | ICD-10-CM | POA: Diagnosis not present

## 2020-12-11 DIAGNOSIS — Z13220 Encounter for screening for lipoid disorders: Secondary | ICD-10-CM | POA: Diagnosis not present

## 2021-02-01 DIAGNOSIS — Z1152 Encounter for screening for COVID-19: Secondary | ICD-10-CM | POA: Diagnosis not present

## 2021-02-01 DIAGNOSIS — H6983 Other specified disorders of Eustachian tube, bilateral: Secondary | ICD-10-CM | POA: Diagnosis not present

## 2021-02-01 DIAGNOSIS — R051 Acute cough: Secondary | ICD-10-CM | POA: Diagnosis not present

## 2021-02-01 DIAGNOSIS — R0981 Nasal congestion: Secondary | ICD-10-CM | POA: Diagnosis not present

## 2021-02-22 DIAGNOSIS — H9319 Tinnitus, unspecified ear: Secondary | ICD-10-CM | POA: Diagnosis not present

## 2021-02-22 DIAGNOSIS — R202 Paresthesia of skin: Secondary | ICD-10-CM | POA: Diagnosis not present

## 2021-04-10 DIAGNOSIS — Z87898 Personal history of other specified conditions: Secondary | ICD-10-CM | POA: Diagnosis not present

## 2021-04-10 DIAGNOSIS — H9313 Tinnitus, bilateral: Secondary | ICD-10-CM | POA: Diagnosis not present

## 2021-04-11 DIAGNOSIS — D225 Melanocytic nevi of trunk: Secondary | ICD-10-CM | POA: Diagnosis not present

## 2021-04-11 DIAGNOSIS — L918 Other hypertrophic disorders of the skin: Secondary | ICD-10-CM | POA: Diagnosis not present

## 2021-04-11 DIAGNOSIS — L812 Freckles: Secondary | ICD-10-CM | POA: Diagnosis not present

## 2021-05-08 DIAGNOSIS — Z20822 Contact with and (suspected) exposure to covid-19: Secondary | ICD-10-CM | POA: Diagnosis not present

## 2021-05-14 DIAGNOSIS — N644 Mastodynia: Secondary | ICD-10-CM | POA: Diagnosis not present

## 2021-05-14 DIAGNOSIS — Z20822 Contact with and (suspected) exposure to covid-19: Secondary | ICD-10-CM | POA: Diagnosis not present

## 2021-05-14 DIAGNOSIS — E669 Obesity, unspecified: Secondary | ICD-10-CM | POA: Diagnosis not present

## 2021-05-23 DIAGNOSIS — Z20822 Contact with and (suspected) exposure to covid-19: Secondary | ICD-10-CM | POA: Diagnosis not present

## 2021-08-21 DIAGNOSIS — Z113 Encounter for screening for infections with a predominantly sexual mode of transmission: Secondary | ICD-10-CM | POA: Diagnosis not present

## 2021-08-21 DIAGNOSIS — Z01419 Encounter for gynecological examination (general) (routine) without abnormal findings: Secondary | ICD-10-CM | POA: Diagnosis not present

## 2021-12-03 DIAGNOSIS — Z111 Encounter for screening for respiratory tuberculosis: Secondary | ICD-10-CM | POA: Diagnosis not present

## 2021-12-06 DIAGNOSIS — Z111 Encounter for screening for respiratory tuberculosis: Secondary | ICD-10-CM | POA: Diagnosis not present

## 2023-02-01 ENCOUNTER — Encounter (HOSPITAL_COMMUNITY): Payer: Self-pay

## 2023-02-01 ENCOUNTER — Other Ambulatory Visit: Payer: Self-pay

## 2023-02-01 ENCOUNTER — Emergency Department (HOSPITAL_COMMUNITY)
Admission: EM | Admit: 2023-02-01 | Discharge: 2023-02-01 | Disposition: A | Discharge status: Home / Self Care | Payer: Worker's Compensation | Attending: Emergency Medicine | Admitting: Emergency Medicine

## 2023-02-01 ENCOUNTER — Emergency Department (HOSPITAL_COMMUNITY): Payer: Worker's Compensation

## 2023-02-01 DIAGNOSIS — S0990XA Unspecified injury of head, initial encounter: Secondary | ICD-10-CM | POA: Diagnosis present

## 2023-02-01 DIAGNOSIS — H5702 Anisocoria: Secondary | ICD-10-CM | POA: Diagnosis not present

## 2023-02-01 DIAGNOSIS — W228XXA Striking against or struck by other objects, initial encounter: Secondary | ICD-10-CM | POA: Diagnosis not present

## 2023-02-01 LAB — BASIC METABOLIC PANEL
Anion gap: 9 (ref 5–15)
BUN: 11 mg/dL (ref 6–20)
CO2: 23 mmol/L (ref 22–32)
Calcium: 8.7 mg/dL — ABNORMAL LOW (ref 8.9–10.3)
Chloride: 106 mmol/L (ref 98–111)
Creatinine, Ser: 0.64 mg/dL (ref 0.44–1.00)
GFR, Estimated: >60 mL/min (ref >60)
Glucose, Bld: 101 mg/dL — ABNORMAL HIGH (ref 70–99)
Potassium: 3.6 mmol/L (ref 3.5–5.1)
Sodium: 138 mmol/L (ref 135–145)

## 2023-02-01 LAB — HCG, QUANTITATIVE, PREGNANCY: hCG, Beta Chain, Quant, S: <1 m[IU]/mL (ref <5)

## 2023-02-01 MED ORDER — IOHEXOL 350 MG/ML SOLN
75.0000 mL | Freq: Once | INTRAVENOUS | Status: AC | PRN
  Administered 2023-02-01: 75 mL via INTRAVENOUS

## 2023-02-01 MED ORDER — ONDANSETRON 4 MG PO TBDP
4.0000 mg | ORAL_TABLET | Freq: Once | ORAL | Status: AC
  Administered 2023-02-01: 4 mg via ORAL
  Filled 2023-02-01: qty 1

## 2023-02-01 NOTE — ED Provider Notes (Signed)
[facility] Provider Note   CSN: [id number] Arrival date & time: 02/01/23  [id number]     History  Chief Complaint  Patient presents with   Head Injury    Jamie Sosa is a 32 y.o. female.  Patient works as Art therapist.  States she struck the top of her head on a piece of equipment 5 days ago.  Had minimal pain at the time did not lose consciousness, did not have any vomiting but has had some nausea.  Did not have any headache for 2 days.  Has had increased nausea since yesterday but is also starting her menses.  She became concerned last night because she noticed that her left pupil seem larger than the right.  Denies any eye pain or vision changes.  No spots in vision, no blurred or double vision.  No focal weakness, numbness or tingling.  No bowel or bladder incontinence.  No fever or vomiting.  No loss of consciousness.  No known history of unequal pupils in the past.  Denies any significant headache currently.  No neck pain or back pain. No dizziness or lightheadedness.  The history is provided by the patient.  Head Injury Associated symptoms: headache and nausea   Associated symptoms: no vomiting        Home Medications Prior to Admission medications   Medication Sig Start Date End Date Taking? Authorizing Provider  benzocaine-Menthol (DERMOPLAST) 20-0.5 % AERO Apply 1 application topically as needed (perineal discomfort). 08/15/11   Frederico Hamman, MD  ferrous sulfate 325 (65 FE) MG tablet Take 1 tablet (325 mg total) by mouth 2 (two) times daily with a meal. 08/15/11 08/14/12  Frederico Hamman, MD      Allergies    Patient has no known allergies.    Review of Systems   Review of Systems  Constitutional:  Negative for activity change, appetite change and fever.  HENT:  Negative for congestion and rhinorrhea.   Eyes:  Negative for visual disturbance.  Respiratory:  Negative for shortness of breath.   Cardiovascular:   Negative for chest pain.  Gastrointestinal:  Positive for nausea. Negative for abdominal pain and vomiting.  Genitourinary:  Negative for vaginal bleeding and vaginal pain.  Musculoskeletal:  Negative for arthralgias and myalgias.  Skin:  Negative for rash.  Neurological:  Positive for headaches. Negative for dizziness and light-headedness.   all other systems are negative except as noted in the HPI and PMH.    Physical Exam Updated Vital Signs BP 135/86 (BP Location: Left Arm)   Pulse 88   Temp 98.6 F (37 C) (Oral)   Resp 17   Ht '5\' 5"'$  (1.651 m)   Wt 70.3 kg   LMP 01/02/2023 (Exact Date)   SpO2 100%   BMI 25.79 kg/m  Physical Exam Vitals and nursing note reviewed.  Constitutional:      General: She is not in acute distress.    Appearance: She is well-developed.  HENT:     Head: Normocephalic and atraumatic.     Mouth/Throat:     Pharynx: No oropharyngeal exudate.  Eyes:     Conjunctiva/sclera: Conjunctivae normal.     Pupils: Pupils are equal, round, and reactive to light.     Comments: Left pupil slightly larger than right approximately 5 mm versus 3 mm.  Neck:     Comments: No meningismus. Cardiovascular:     Rate and Rhythm: Normal rate and regular rhythm.  Heart sounds: Normal heart sounds. No murmur heard. Pulmonary:     Effort: Pulmonary effort is normal. No respiratory distress.     Breath sounds: Normal breath sounds.  Abdominal:     Palpations: Abdomen is soft.     Tenderness: There is no abdominal tenderness. There is no guarding or rebound.  Musculoskeletal:        General: No tenderness. Normal range of motion.     Cervical back: Normal range of motion and neck supple.  Skin:    General: Skin is warm.  Neurological:     Mental Status: She is alert and oriented to person, place, and time.     Cranial Nerves: No cranial nerve deficit.     Motor: No abnormal muscle tone.     Coordination: Coordination normal.     Comments: CN 2-12 intact, no  ataxia on finger to nose, no nystagmus, 5/5 strength throughout, no pronator drift, Romberg negative, normal gait.   Psychiatric:        Behavior: Behavior normal.     ED Results / Procedures / Treatments   Labs (all labs ordered are listed, but only abnormal results are displayed) Labs Reviewed  BASIC METABOLIC PANEL - Abnormal; Notable for the following components:      Result Value   Glucose, Bld 101 (*)    Calcium 8.7 (*)    All other components within normal limits  HCG, QUANTITATIVE, PREGNANCY    EKG None  Radiology CT Angio Head W or Wo Contrast  Result Date: 02/01/2023 CLINICAL DATA:  Headache EXAM: CT ANGIOGRAPHY HEAD TECHNIQUE: Multidetector CT imaging of the head was performed using the standard protocol during bolus administration of intravenous contrast. Multiplanar CT image reconstructions and MIPs were obtained to evaluate the vascular anatomy. RADIATION DOSE REDUCTION: This exam was performed according to the departmental dose-optimization program which includes automated exposure control, adjustment of the mA and/or kV according to patient size and/or use of iterative reconstruction technique. CONTRAST:  32m OMNIPAQUE IOHEXOL 350 MG/ML SOLN COMPARISON:  None Available. FINDINGS: CT HEAD Brain: No evidence of acute infarction, hemorrhage, hydrocephalus, extra-axial collection or mass lesion/mass effect. Vascular: No hyperdense vessel or unexpected calcification. See below Skull: Normal. Negative for fracture or focal lesion. Sinuses: No middle ear or mastoid effusion. Paranasal sinuses are clear. Orbits are unremarkable. Other: None. CTA HEAD Anterior circulation: No significant stenosis, proximal occlusion, aneurysm, or vascular malformation. Posterior circulation: No significant stenosis, proximal occlusion, aneurysm, or vascular malformation. Venous sinuses: As permitted by contrast timing, patent. Anatomic variants: None Review of the MIP images confirms the above  findings. IMPRESSION: 1. No acute intracranial abnormality. 2. No intracranial large vessel occlusion, significant stenosis, aneurysm, or vascular malformation. Electronically Signed   By: HMarin RobertsM.D.   On: 02/01/2023 13:33    Procedures Procedures    Medications Ordered in ED Medications  ondansetron (ZOFRAN-ODT) disintegrating tablet 4 mg (has no administration in time range)    ED Course/ Medical Decision Making/ A&P                             Medical Decision Making Amount and/or Complexity of Data Reviewed Labs: ordered. Decision-making details documented in ED Course. Radiology: ordered and independent interpretation performed. Decision-making details documented in ED Course. ECG/medicine tests: ordered and independent interpretation performed. Decision-making details documented in ED Course.  Risk Prescription drug management.  Head injury sustained 5 days ago.  Nausea for the past  3 days and then noticed unequal pupils last night. No visual changes.   Nonfocal neurological exam other than questionably unequal pupils.  She is not aware of the acuity of this.  Risks and benefits of CT scan discussed with patient and she like to proceed  CT head is negative for hemorrhage.  Results reviewed interpreted by me.  No evidence of vascular aneurysm or stenosis.  Discussed with patient PCP as well as ophthalmology follow-up.  Discussed that some people do have unequal pupils at baseline.  No evidence of aneurysm today.  No significant pupil abnormality seen today or asymmetry today seen.  Head injury precautions given.  Discussed p.o. hydration, antinausea medication, brain rest and PCP follow-up.  Return to the ED with worsening headache, vision changes, focal weakness, numbness or tingling or other concerns.        Final Clinical Impression(s) / ED Diagnoses Final diagnoses:  Injury of head, initial encounter  Anisocoria    Rx / DC Orders ED Discharge Orders      None         Jermall Isaacson, Annie Main, MD 02/01/23 1652

## 2023-02-01 NOTE — Discharge Instructions (Addendum)
Testing today is reassuring.  No evidence of bleeding or aneurysm.  Keep yourself hydrated follow-up with your primary doctor.  Return to the ED with sudden onset headache, unilateral weakness, numbness, tingling, difficulty speaking, difficulty swallowing or other concerns.

## 2023-02-01 NOTE — ED Triage Notes (Signed)
Pt presents to ED from home. Reports hitting head at work on Thursday. Denies headache. Reports nausea since yesterday. Reports concern for unequal pupils. PERRLA in triage.

## 2024-07-06 ENCOUNTER — Other Ambulatory Visit: Payer: Self-pay

## 2024-07-06 DIAGNOSIS — R0789 Other chest pain: Secondary | ICD-10-CM

## 2024-07-06 DIAGNOSIS — R09A2 Foreign body sensation, throat: Secondary | ICD-10-CM

## 2024-07-07 ENCOUNTER — Ambulatory Visit
Admission: RE | Admit: 2024-07-07 | Discharge: 2024-07-07 | Disposition: A | Discharge status: Home / Self Care | Source: Ambulatory Visit

## 2024-07-07 DIAGNOSIS — R0789 Other chest pain: Secondary | ICD-10-CM

## 2024-07-07 DIAGNOSIS — R09A2 Foreign body sensation, throat: Secondary | ICD-10-CM

## 2024-07-11 ENCOUNTER — Ambulatory Visit: Payer: Self-pay | Admitting: Nurse Practitioner

## 2024-07-18 ENCOUNTER — Other Ambulatory Visit: Payer: Self-pay

## 2024-07-18 DIAGNOSIS — R09A2 Foreign body sensation, throat: Secondary | ICD-10-CM

## 2024-07-18 DIAGNOSIS — R0789 Other chest pain: Secondary | ICD-10-CM

## 2024-09-01 ENCOUNTER — Ambulatory Visit
Admission: RE | Admit: 2024-09-01 | Discharge: 2024-09-01 | Disposition: A | Discharge status: Home / Self Care | Source: Ambulatory Visit

## 2024-09-01 DIAGNOSIS — R09A2 Foreign body sensation, throat: Secondary | ICD-10-CM

## 2024-09-01 DIAGNOSIS — R0789 Other chest pain: Secondary | ICD-10-CM

## 2024-09-23 ENCOUNTER — Other Ambulatory Visit: Payer: Self-pay | Admitting: Gastroenterology

## 2024-09-23 ENCOUNTER — Encounter: Payer: Self-pay | Admitting: Gastroenterology

## 2024-09-23 DIAGNOSIS — R0789 Other chest pain: Secondary | ICD-10-CM

## 2024-09-23 DIAGNOSIS — R1013 Epigastric pain: Secondary | ICD-10-CM

## 2024-09-23 DIAGNOSIS — R634 Abnormal weight loss: Secondary | ICD-10-CM

## 2024-09-23 DIAGNOSIS — R1319 Other dysphagia: Secondary | ICD-10-CM

## 2024-09-28 ENCOUNTER — Inpatient Hospital Stay: Admission: RE | Admit: 2024-09-28 | Discharge: 2024-09-28 | Attending: Gastroenterology | Admitting: Gastroenterology

## 2024-09-28 DIAGNOSIS — R1013 Epigastric pain: Secondary | ICD-10-CM

## 2024-09-28 DIAGNOSIS — R1319 Other dysphagia: Secondary | ICD-10-CM

## 2024-09-28 DIAGNOSIS — R634 Abnormal weight loss: Secondary | ICD-10-CM

## 2024-09-28 DIAGNOSIS — R0789 Other chest pain: Secondary | ICD-10-CM
